# Patient Record
Sex: Female | Born: 1935 | Race: Black or African American | Hispanic: No | Smoking: Never smoker
Health system: Southern US, Community
[De-identification: ages and names within clinical notes are randomized; demographics above are authoritative.]

## PROBLEM LIST (undated history)

## (undated) DIAGNOSIS — Y92009 Unspecified place in unspecified non-institutional (private) residence as the place of occurrence of the external cause: Secondary | ICD-10-CM

## (undated) DIAGNOSIS — G5600 Carpal tunnel syndrome, unspecified upper limb: Secondary | ICD-10-CM

## (undated) DIAGNOSIS — W19XXXA Unspecified fall, initial encounter: Secondary | ICD-10-CM

## (undated) DIAGNOSIS — K219 Gastro-esophageal reflux disease without esophagitis: Secondary | ICD-10-CM

## (undated) DIAGNOSIS — I10 Essential (primary) hypertension: Secondary | ICD-10-CM

## (undated) DIAGNOSIS — R159 Full incontinence of feces: Secondary | ICD-10-CM

## (undated) DIAGNOSIS — R634 Abnormal weight loss: Secondary | ICD-10-CM

## (undated) DIAGNOSIS — M47812 Spondylosis without myelopathy or radiculopathy, cervical region: Secondary | ICD-10-CM

## (undated) DIAGNOSIS — J45909 Unspecified asthma, uncomplicated: Secondary | ICD-10-CM

## (undated) DIAGNOSIS — F329 Major depressive disorder, single episode, unspecified: Secondary | ICD-10-CM

## (undated) DIAGNOSIS — R768 Other specified abnormal immunological findings in serum: Secondary | ICD-10-CM

## (undated) DIAGNOSIS — K5790 Diverticulosis of intestine, part unspecified, without perforation or abscess without bleeding: Secondary | ICD-10-CM

## (undated) DIAGNOSIS — K922 Gastrointestinal hemorrhage, unspecified: Secondary | ICD-10-CM

## (undated) DIAGNOSIS — M67919 Unspecified disorder of synovium and tendon, unspecified shoulder: Secondary | ICD-10-CM

## (undated) DIAGNOSIS — E785 Hyperlipidemia, unspecified: Secondary | ICD-10-CM

## (undated) DIAGNOSIS — M1990 Unspecified osteoarthritis, unspecified site: Secondary | ICD-10-CM

## (undated) DIAGNOSIS — M353 Polymyalgia rheumatica: Secondary | ICD-10-CM

## (undated) DIAGNOSIS — I672 Cerebral atherosclerosis: Secondary | ICD-10-CM

## (undated) DIAGNOSIS — F419 Anxiety disorder, unspecified: Secondary | ICD-10-CM

## (undated) DIAGNOSIS — M109 Gout, unspecified: Secondary | ICD-10-CM

## (undated) DIAGNOSIS — F32A Depression, unspecified: Secondary | ICD-10-CM

## (undated) HISTORY — DX: Unspecified disorder of synovium and tendon, unspecified shoulder: M67.919

## (undated) HISTORY — DX: Essential (primary) hypertension: I10

## (undated) HISTORY — DX: Unspecified place in unspecified non-institutional (private) residence as the place of occurrence of the external cause: Y92.009

## (undated) HISTORY — DX: Major depressive disorder, single episode, unspecified: F32.9

## (undated) HISTORY — DX: Diverticulosis of intestine, part unspecified, without perforation or abscess without bleeding: K57.90

## (undated) HISTORY — DX: Polymyalgia rheumatica: M35.3

## (undated) HISTORY — DX: Unspecified place in unspecified non-institutional (private) residence as the place of occurrence of the external cause: W19.XXXA

## (undated) HISTORY — DX: Cerebral atherosclerosis: I67.2

## (undated) HISTORY — DX: Gastrointestinal hemorrhage, unspecified: K92.2

## (undated) HISTORY — DX: Spondylosis without myelopathy or radiculopathy, cervical region: M47.812

## (undated) HISTORY — DX: Unspecified osteoarthritis, unspecified site: M19.90

## (undated) HISTORY — DX: Unspecified asthma, uncomplicated: J45.909

## (undated) HISTORY — DX: Gastro-esophageal reflux disease without esophagitis: K21.9

## (undated) HISTORY — DX: Other specified abnormal immunological findings in serum: R76.8

## (undated) HISTORY — DX: Carpal tunnel syndrome, unspecified upper limb: G56.00

## (undated) HISTORY — DX: Full incontinence of feces: R15.9

## (undated) HISTORY — DX: Hyperlipidemia, unspecified: E78.5

## (undated) HISTORY — DX: Depression, unspecified: F32.A

## (undated) HISTORY — DX: Gout, unspecified: M10.9

## (undated) HISTORY — DX: Anxiety disorder, unspecified: F41.9

## (undated) HISTORY — DX: Abnormal weight loss: R63.4

---

## 2001-09-28 DIAGNOSIS — K922 Gastrointestinal hemorrhage, unspecified: Secondary | ICD-10-CM

## 2001-09-28 HISTORY — DX: Gastrointestinal hemorrhage, unspecified: K92.2

## 2002-03-01 ENCOUNTER — Encounter: Payer: Self-pay | Admitting: Emergency Medicine

## 2002-03-01 ENCOUNTER — Inpatient Hospital Stay (HOSPITAL_COMMUNITY): Admission: EM | Admit: 2002-03-01 | Discharge: 2002-03-06 | Payer: Self-pay | Admitting: Emergency Medicine

## 2005-06-26 ENCOUNTER — Emergency Department (HOSPITAL_COMMUNITY): Admission: EM | Admit: 2005-06-26 | Discharge: 2005-06-26 | Payer: Self-pay | Admitting: Emergency Medicine

## 2006-05-10 ENCOUNTER — Encounter: Admission: RE | Admit: 2006-05-10 | Discharge: 2006-05-10 | Payer: Self-pay | Admitting: Internal Medicine

## 2008-08-22 ENCOUNTER — Ambulatory Visit (HOSPITAL_COMMUNITY): Admission: RE | Admit: 2008-08-22 | Discharge: 2008-08-22 | Payer: Self-pay | Admitting: Internal Medicine

## 2008-08-22 ENCOUNTER — Encounter (INDEPENDENT_AMBULATORY_CARE_PROVIDER_SITE_OTHER): Payer: Self-pay | Admitting: Internal Medicine

## 2008-08-22 ENCOUNTER — Ambulatory Visit: Payer: Self-pay | Admitting: Vascular Surgery

## 2011-02-13 NOTE — H&P (Signed)
[place]  Patient:    Rebekah Clark, Rebekah Clark Visit Number: [id number] MRN: [id number]          Service Type: MED Location: [id number] Attending Physician:  Osvaldo Human Dictated by:   Thora Lance, M.D. Admit Date:  03/01/2002   CC:         Pearla Dubonnet, M.D.   History and Physical  CHIEF COMPLAINT: Rectal bleeding.  HISTORY OF PRESENT ILLNESS: This is an 77 year old black female, with a history of hypertension and diverticulosis, who presents with a lower GI bleed.  She presented to Dr. Kevan Ny at our office one week ago complaining of left lower quadrant abdominal pain and some diarrhea.  She was given five days of Tequin for diverticulitis and also has been taking Advil one or two a day for abdominal pain.  She had improvement in her abdominal pain up until tonight when she had recurrence of some mild left lower quadrant pain followed by a bloody bowel movement.  She then proceeded to have three further bloody bowel movements.  She had a brief syncopal episode while standing and then was brought to the emergency room by EMT.  She was not found to be orthostatic when checked by the EMS.  She has no history of GI bleed in the past.  She does have a history of colon polyps diagnosed by colonoscopy in the 1990s. She has had a follow-up colonoscopy and a sigmoidoscopy about a year ago without recurrence of polyps.  She apparently does have known diverticulosis, per the patient.  PAST MEDICAL HISTORY:  1. Hypertension.  2. DJD.  3. Allergic rhinitis.  4. Hearing loss.  5. Colon polyps.  6. Diverticulosis.  PAST SURGICAL HISTORY:  1. GYN procedure.  2. Another surgery that she does not remember.  ALLERGIES: CODEINE.  CURRENT MEDICATIONS:  1. Benicar 10 mg q.d.  2. Multivitamin.  3. Advil p.r.n.  FAMILY HISTORY: Noncontributory.  SOCIAL HISTORY: Married.  No children.  Nonsmoker.  No alcohol.  She is a retired  Public house manager.  REVIEW OF SYSTEMS: Denies chest pain, shortness of breath, palpitations.  PHYSICAL EXAMINATION:  GENERAL: Well-appearing black female.  VITAL SIGNS: Blood pressure 147/78, heart rate 109, respirations 18, temperature 98.7 degrees.  Oxygen saturation 97% on room air.  HEENT: PERRL.  EOMI.  Funduscopic not done.  Ears, tympanic membranes are clear.  Oral cavity and pharynx clear.  NECK: Supple.  No lymphadenopathy, no carotid bruits.  LUNGS: Clear.  HEART: Regular rate and rhythm without murmur, gallop, or rub.  ABDOMEN: Soft.  There is mild left lower quadrant tenderness.  No rebound or guarding.  Normal bowel sounds.  GYN: Deferred.  RECTAL: Gross blood per EDP.  EXTREMITIES: No edema.  NEUROLOGIC: Nonfocal.  LABORATORY DATA: CBC, hemoglobin 8.5, WBC 7.6, platelet count 270,000; MCV 85. Chemistries - sodium 142, potassium 3.9, chloride 106, carbon dioxide 29, glucose 194, BUN 18, creatinine 1.3, calcium 8.3, total protein 5.7, albumin 2.2, SGOT 15, SGPT 9, alkaline phosphatase 69, total bilirubin 0.2.  Amylase 113, lipase 34.  Urinalysis negative.  ASSESSMENT:  1. Lower gastrointestinal bleed, likely diverticular bleed.  She has had     colonoscopy x2 and flexible sigmoidoscopy x1 over the last several years.  2. Brief syncopal episode, likely related to #1.  3. Anemia secondary to #1.  4. Recent diverticulitis.  Still has some tenderness in the left lower     quadrant.  5. Hypertension.  PLAN:  1. Admit.  2. IV fluids.  3. Serial hemoglobins.  4. Transfuse if hemoglobin drops lower than 8.  5. Tequin p.o.  6. Clear liquids.  7. Will check old records on recent sigmoidoscopy/colonoscopy.  She may not     need a repeat procedure. Dictated by:   Thora Lance, M.D. Attending Physician:  Osvaldo Human DD:  03/01/02 TD:  03/02/02 Job: [id number]

## 2011-02-13 NOTE — Discharge Summary (Signed)
[place]  Patient:    Rebekah Clark, Rebekah Clark Visit Number: [id number] MRN: [id number]          Service Type: MED Location: 3700 [id number] [id number] Attending Physician:  Pearla Dubonnet Dictated by:   Pearla Dubonnet, M.D. Admit Date:  03/01/2002 Discharge Date: 03/06/2002                             Discharge Summary  PROBLEM LIST: 1. Lower gastrointestinal bleed, resolved. 2. Diverticulosis with resolved diverticulitis. 3. Hypertension. 4. Allergic rhinitis. 5. Hearing loss. 6. History of colon polyps.  DISCHARGE MEDICATIONS: 1. Benicar 10 mg daily. 2. Multivitamin one daily.  HISTORY OF PRESENT ILLNESS:  The patient is a very pleasant 77 year old female with a history of hypertension and diverticulosis, who presented on March 01, 2002 with bright red blood per rectum.  One week prior to admission, she had had some left lower quadrant abdominal pain and diarrhea and was given Tequin for five days for diverticulitis; she also had been taking one or two Advil a day for pain in the lower quadrant.  The night prior to admission, she had a bloody bowel movement followed by three more bloody bowel movements over the next couple of hours.  She had a brief syncopal episode apparently and was brought into the emergency room where she was not orthostatic but was admitted for a lower GI bleed and observation period.  HOSPITAL COURSE: #1 - GASTROINTESTINAL BLEED:  The patients hemoglobin in the emergency room was noted to be 8.5 at 3:55 a.m. but by 1330, it was 7.4.  She was transfused two units of blood and by 2355 on March 01, 2002, her hemoglobin was up to 9.4. She had no further episodes of rectal bleeding until the afternoon and evening of March 02, 2002 -- the next day -- where she again had some bright red blood per rectum and hemoglobin had dropped from 10.1 to 8.3.  She was transfused two more units of blood and hemoglobin on March 03, 2002 was 11.1.   Over the next several days, she did continue to clear some blood per rectum but hemoglobins remained stable and discharge hemoglobin was 10.6 on March 06, 2002.  Otherwise, the patients hospitalization was uneventful.  She had had a history of colonoscopy x2 in the past and had had a flexible sigmoidoscopy one year prior to admission.  The patient was discharged in stable condition on March 06, 2002.  #2 - HYPERTENSION:  In terms of her hypertension, her blood pressure was well-controlled while hospitalized on Avapro and she was switched back to Benicar on discharge.  She was eating well without pain, with a stable hemoglobin on March 06, 2002 and the patient was discharged home.  #3 - She was treated with Tequin 400 mg while hospitalized for any possible residual diverticulitis. Dictated by:   Pearla Dubonnet, M.D. Attending Physician:  Pearla Dubonnet DD:  04/19/02 TD:  04/25/02 Job: [phone] [id number]

## 2013-12-05 ENCOUNTER — Ambulatory Visit
Admission: RE | Admit: 2013-12-05 | Discharge: 2013-12-05 | Disposition: A | Discharge status: Home / Self Care | Payer: Medicare Other | Source: Ambulatory Visit | Attending: Internal Medicine | Admitting: Internal Medicine

## 2013-12-05 ENCOUNTER — Other Ambulatory Visit: Payer: Self-pay | Admitting: Internal Medicine

## 2013-12-05 DIAGNOSIS — R634 Abnormal weight loss: Secondary | ICD-10-CM

## 2015-02-05 IMAGING — CR DG CHEST 2V
2 series · 2 of 2 positions shown · non-contrast
Comparison: none

[w chest pa]
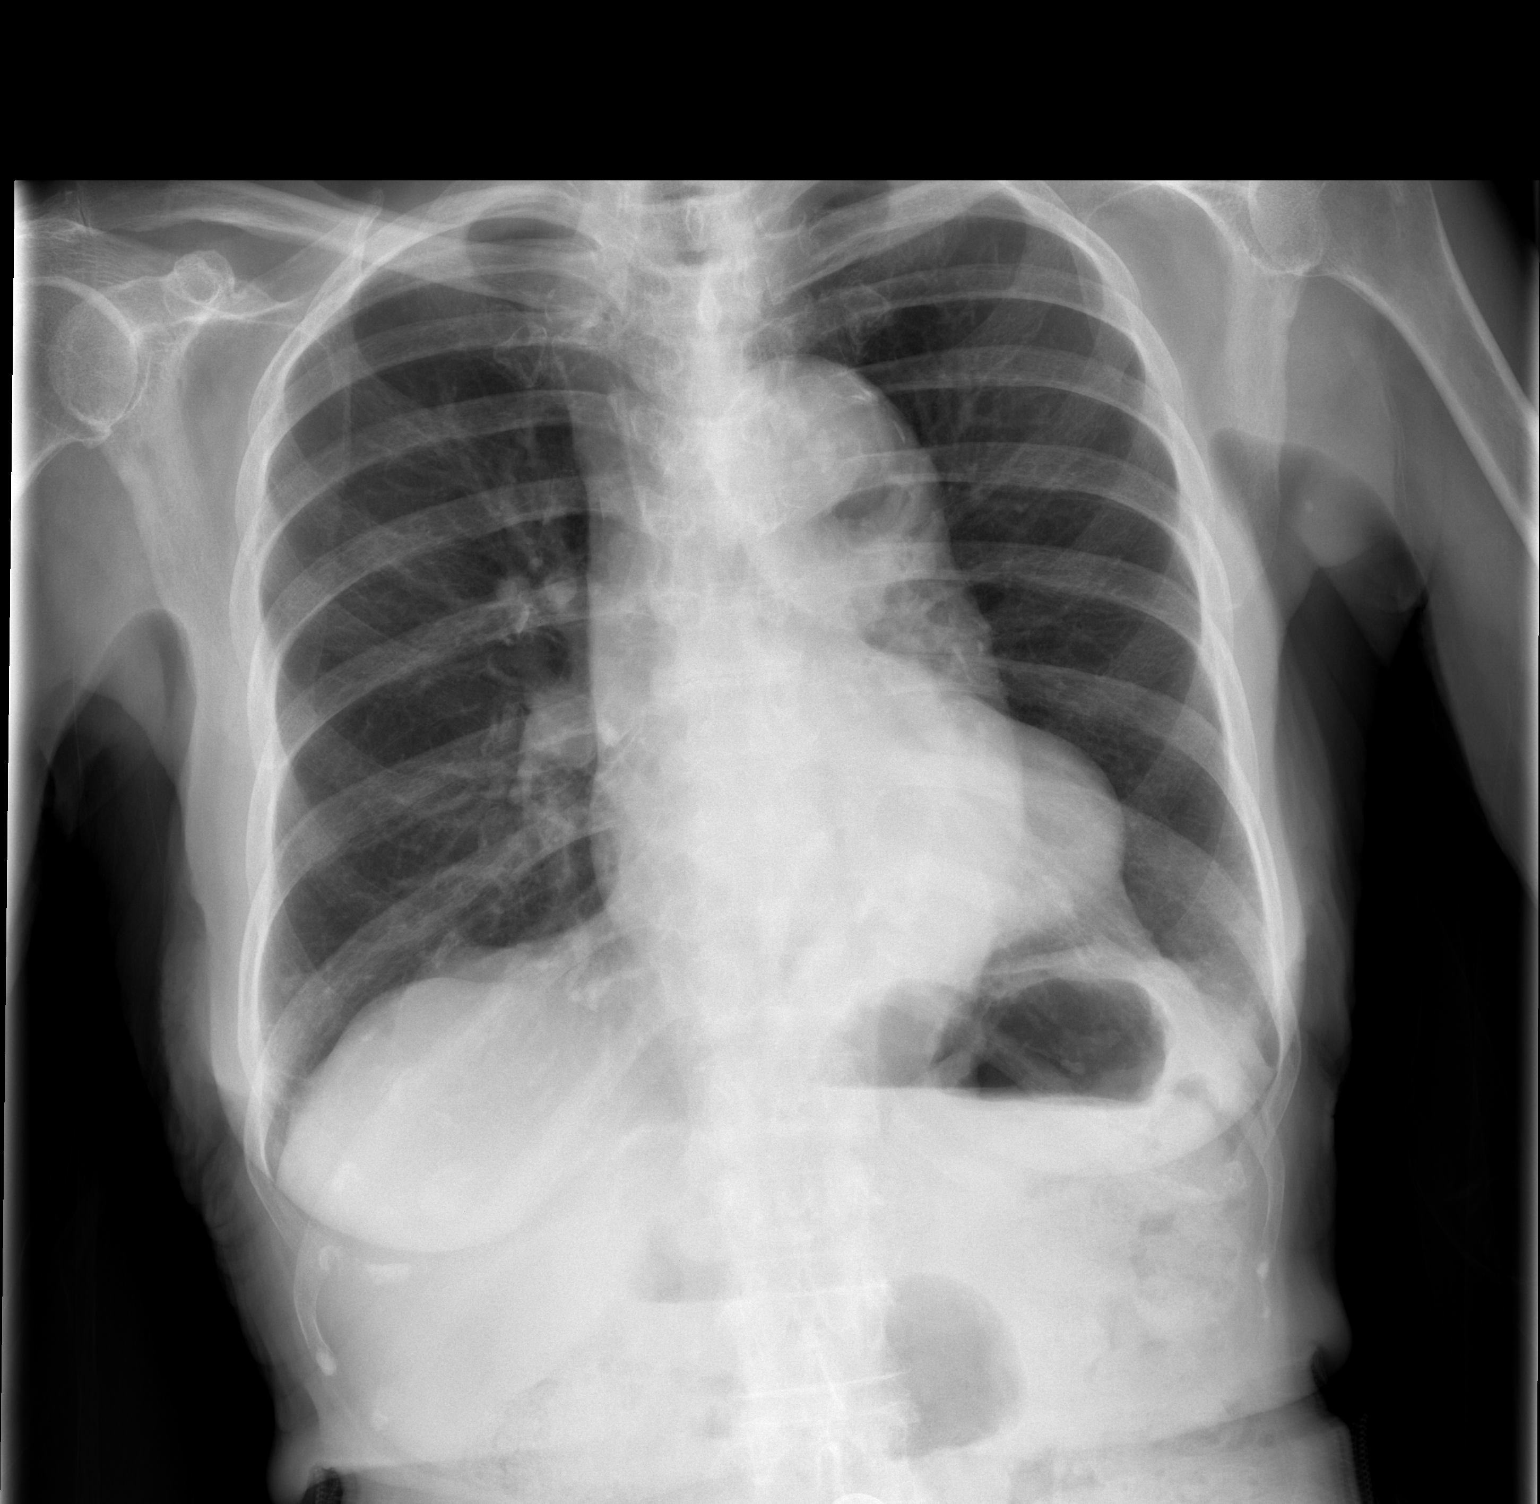

[w chest lat]
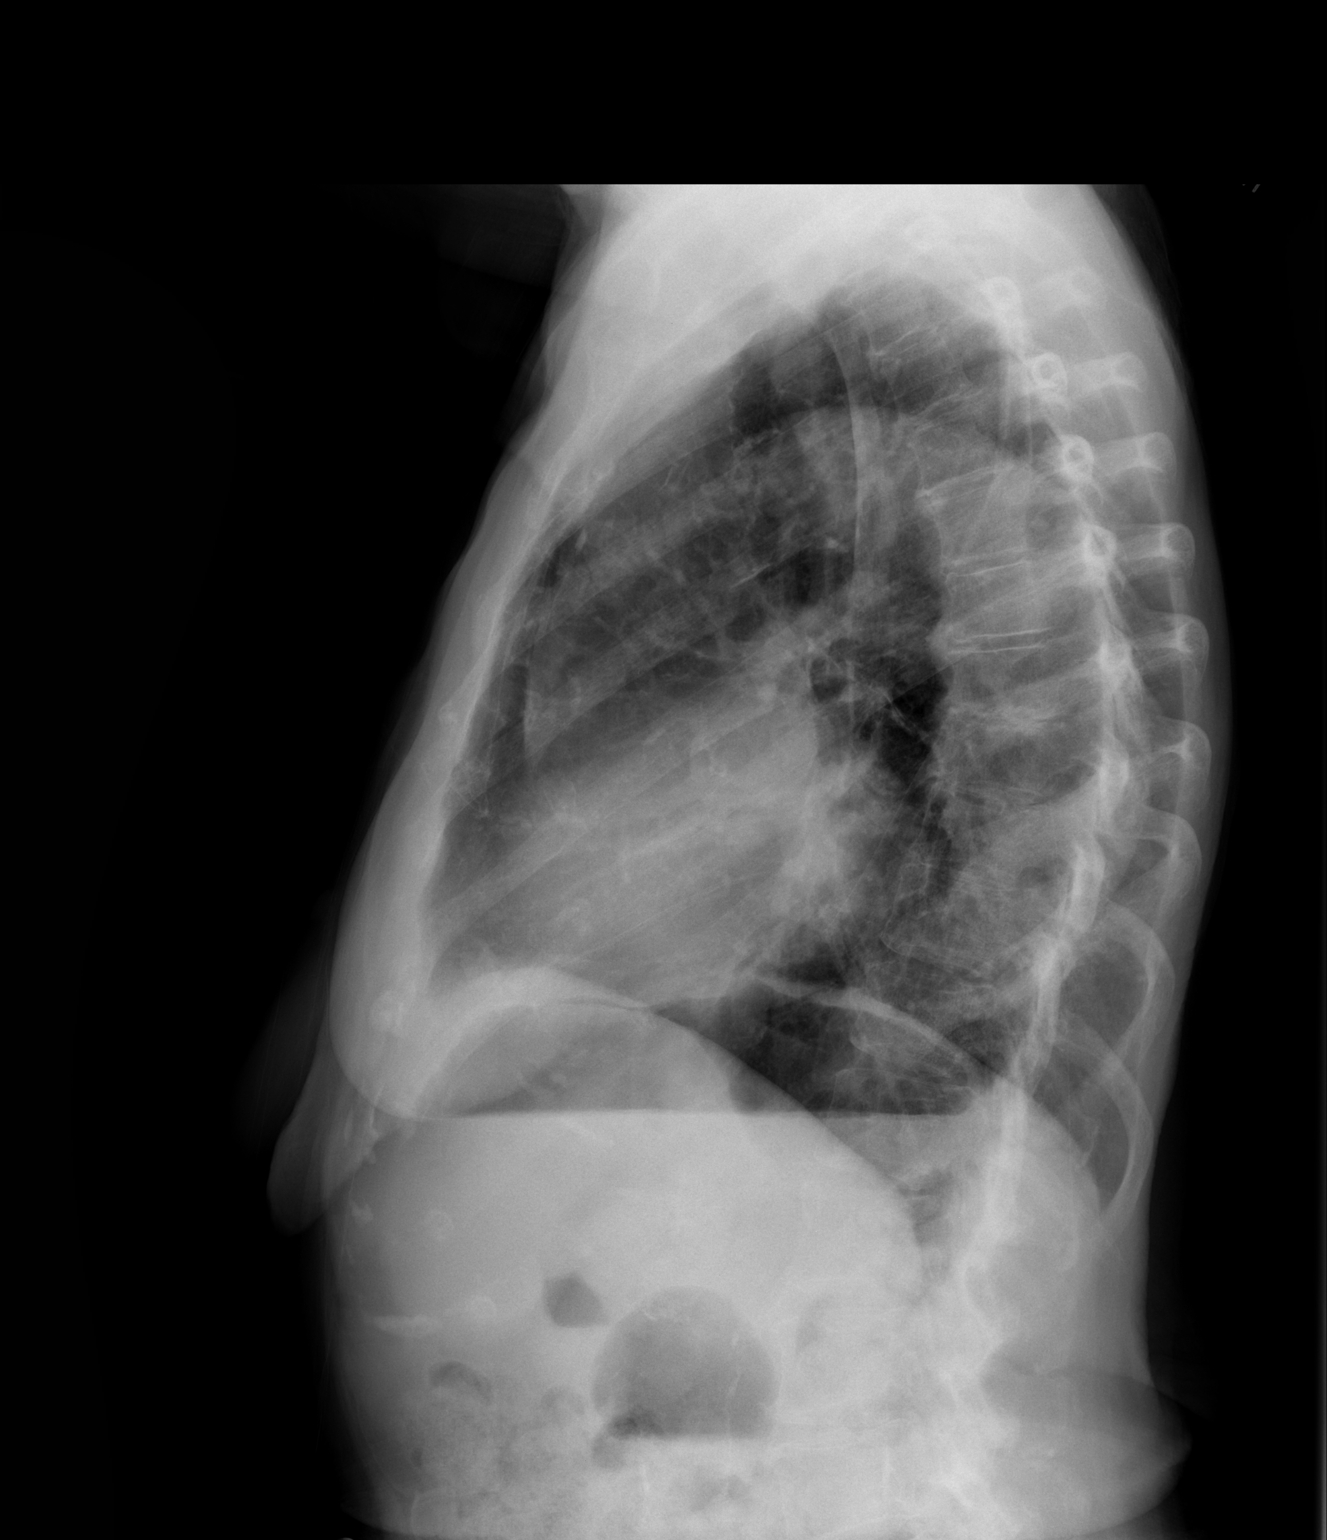

[2 of 2 positions shown; findings below may reference images not displayed]

CLINICAL DATA
Abnormal weight loss, left upper anterior chest pain

EXAM
CHEST  2 VIEW

COMPARISON
None.

FINDINGS
No active infiltrate or effusion is seen. The lungs are slightly
hyperaerated. The descending thoracic aorta is prominent and
ectatic. Correlation with prior chest x-ray or followup chest x-ray
is recommended to assess stability. The heart is mildly enlarged. No
acute bony abnormality is seen.

IMPRESSION
1. No infiltrate or effusion.
2. Widened and ectatic descending thoracic aorta. Cannot exclude an
acute process. Recommend comparison with prior or followup chest
x-ray versus CT of the chest if warranted clinically.

SIGNATURE

## 2015-06-13 ENCOUNTER — Non-Acute Institutional Stay (SKILLED_NURSING_FACILITY): Payer: Medicare Other | Admitting: Adult Health

## 2015-06-13 DIAGNOSIS — M353 Polymyalgia rheumatica: Secondary | ICD-10-CM

## 2015-06-13 DIAGNOSIS — I10 Essential (primary) hypertension: Secondary | ICD-10-CM | POA: Diagnosis not present

## 2015-06-13 DIAGNOSIS — M47812 Spondylosis without myelopathy or radiculopathy, cervical region: Secondary | ICD-10-CM

## 2015-06-13 DIAGNOSIS — J4520 Mild intermittent asthma, uncomplicated: Secondary | ICD-10-CM | POA: Diagnosis not present

## 2015-06-13 DIAGNOSIS — E785 Hyperlipidemia, unspecified: Secondary | ICD-10-CM

## 2015-06-13 DIAGNOSIS — K219 Gastro-esophageal reflux disease without esophagitis: Secondary | ICD-10-CM

## 2015-06-17 ENCOUNTER — Non-Acute Institutional Stay (SKILLED_NURSING_FACILITY): Payer: Medicare Other | Admitting: Internal Medicine

## 2015-06-17 DIAGNOSIS — F418 Other specified anxiety disorders: Secondary | ICD-10-CM | POA: Diagnosis not present

## 2015-06-17 DIAGNOSIS — K219 Gastro-esophageal reflux disease without esophagitis: Secondary | ICD-10-CM

## 2015-06-17 DIAGNOSIS — E785 Hyperlipidemia, unspecified: Secondary | ICD-10-CM | POA: Diagnosis not present

## 2015-06-17 DIAGNOSIS — J4520 Mild intermittent asthma, uncomplicated: Secondary | ICD-10-CM

## 2015-06-17 DIAGNOSIS — Z8639 Personal history of other endocrine, nutritional and metabolic disease: Secondary | ICD-10-CM

## 2015-06-17 DIAGNOSIS — H542 Low vision, both eyes: Secondary | ICD-10-CM

## 2015-06-17 DIAGNOSIS — Z8739 Personal history of other diseases of the musculoskeletal system and connective tissue: Secondary | ICD-10-CM

## 2015-06-17 DIAGNOSIS — I10 Essential (primary) hypertension: Secondary | ICD-10-CM | POA: Diagnosis not present

## 2015-06-17 DIAGNOSIS — M353 Polymyalgia rheumatica: Secondary | ICD-10-CM

## 2015-06-17 DIAGNOSIS — I350 Nonrheumatic aortic (valve) stenosis: Secondary | ICD-10-CM | POA: Diagnosis not present

## 2015-06-17 DIAGNOSIS — M47812 Spondylosis without myelopathy or radiculopathy, cervical region: Secondary | ICD-10-CM | POA: Diagnosis not present

## 2015-06-17 DIAGNOSIS — H543 Unqualified visual loss, both eyes: Secondary | ICD-10-CM

## 2015-06-17 DIAGNOSIS — F32A Depression, unspecified: Secondary | ICD-10-CM

## 2015-06-17 DIAGNOSIS — F329 Major depressive disorder, single episode, unspecified: Secondary | ICD-10-CM

## 2015-06-17 DIAGNOSIS — F419 Anxiety disorder, unspecified: Secondary | ICD-10-CM

## 2015-07-04 ENCOUNTER — Encounter: Payer: Self-pay | Admitting: Internal Medicine

## 2015-07-07 ENCOUNTER — Encounter: Payer: Self-pay | Admitting: Adult Health

## 2015-07-07 NOTE — Progress Notes (Signed)
Patient ID: Rebekah Clark, female   DOB: 11-Mar-1915, 81 y.o.   MRN: [id number]   Facility: [facility]      Allergies  Allergen Reactions  . Codeine   . Cozaar [Losartan Potassium]   . Crestor [Rosuvastatin Calcium]   . Dyazide [Hydrochlorothiazide W-Triamterene]   . Levaquin [Levofloxacin In D5w]   . Norvasc [Amlodipine Besylate]   . Vasotec [Enalapril]     Chief Complaint  Patient presents with  . Acute Visit    follow up transfer     HPI:  She has been transferred to this facility for long term placement. she states that she is feeling good and is not voicing any complaints or concerns at this time.  There are no nursing concerns at this time.   Past Medical History  Diagnosis Date  . Arthritis   . Hypertension   . Bowel incontinence   . PMR (polymyalgia rheumatica) (HCC)   . Cerebral atherosclerosis   . GERD (gastroesophageal reflux disease)   . Asthma   . Diverticulosis   . Weight loss   . Anxiety   . Depression   . Hyperlipidemia   . Helicobacter pylori ab+   . Gout   . DJD (degenerative joint disease) of cervical spine   . GI bleed 2003  . Rotator cuff disorder     left   . Carpal tunnel syndrome     right  . Fall at home     History reviewed. No pertinent past surgical history.  VITAL SIGNS BP 140/82 mmHg  Pulse 78  Ht  (1.575 m)  Wt 101 lb (45.813 kg)  BMI 18.47 kg/m2  Patient's Medications  New Prescriptions   No medications on file  Previous Medications   ATENOLOL (TENORMIN) 50 MG TABLET    Take 50 mg by mouth daily.   CALCIUM CARB-CHOLECALCIFEROL (CALCIUM 600 + D PO)    Take 1 tablet by mouth daily.   CALCIUM CARB-CHOLECALCIFEROL (LIQUID CALCIUM WITH D3) 600-500 MG-UNIT CAPS    Take by mouth.   CALCIUM CARBONATE (TUMS - DOSED IN MG ELEMENTAL CALCIUM) 500 MG CHEWABLE TABLET    Chew 1 tablet by mouth 4 (four) times daily.   OMEGA-3 FATTY ACIDS (FISH OIL) 1000 MG CAPS    Take 1,000 mg by mouth daily.   PREDNISONE  (DELTASONE) 5 MG TABLET    Take 5 mg by mouth daily with breakfast.   VITAMIN B-12 (CYANOCOBALAMIN) 1000 MCG TABLET    Take 1,000 mcg by mouth daily.  Modified Medications   No medications on file  Discontinued Medications   No medications on file     SIGNIFICANT DIAGNOSTIC EXAMS   LABS REVIEWED:   05-29-15: wbc 5.7; hgb 11.6; hct 36.5; mcv 89.3; plt 155; glucose 106; bun 28; creat 1.73; k+ 3.6; na++147; liver normal albumin 3.4; sed rate 46; vit b 12: 992; crp 33.6; tsh 0.73      Review of Systems  Constitutional: Negative for appetite change and fatigue.  HENT: Negative for congestion.   Respiratory: Negative for cough, chest tightness and shortness of breath.   Cardiovascular: Negative for chest pain, palpitations and leg swelling.  Gastrointestinal: Negative for nausea, abdominal pain, diarrhea and constipation.  Musculoskeletal: Negative for myalgias and arthralgias.  Skin: Negative for pallor.  Neurological: Negative for dizziness.  Psychiatric/Behavioral: The patient is not nervous/anxious.       Physical Exam  Constitutional: No distress.  Frail   Eyes: Conjunctivae are normal.  Neck: Neck  supple. No JVD present. No thyromegaly present.  Cardiovascular: Normal rate, regular rhythm and intact distal pulses.   Murmur heard. 3/6  Respiratory: Effort normal and breath sounds normal. No respiratory distress. She has no wheezes.  GI: Soft. Bowel sounds are normal. She exhibits no distension. There is no tenderness.  Musculoskeletal: She exhibits no edema.  Able to move all extremities   Lymphadenopathy:    She has no cervical adenopathy.  Neurological: She is alert.  Skin: Skin is warm and dry. She is not diaphoretic.  Psychiatric: She has a normal mood and affect.     ASSESSMENT/ PLAN:  1. PMR: is presently stable will continue prednisone 5 mg daily and will monitor her status.   Sed rate 46  2. Hypertension: will continue atenolol 50 mg daily and will  monitor   3. GERD: is presently not on medications; will not make changes will monitor   4. Asthma: is presently not on medications; will not make changes.   5.  Hyperlipidemia: is on fish oil 1 gm daily   6. Cervical spine DJD: is presently not on medication; will not make changes will monitor her status.    Time spent with patient   50 minutes >50% time spent counseling; reviewing medical record; tests; labs; and developing future plan of care       Synthia Innocent NP [facility]  Contact [phone] Monday through Friday 8am- 5pm  After hours call [phone]

## 2015-07-08 DIAGNOSIS — M353 Polymyalgia rheumatica: Secondary | ICD-10-CM | POA: Insufficient documentation

## 2015-07-08 DIAGNOSIS — M47812 Spondylosis without myelopathy or radiculopathy, cervical region: Secondary | ICD-10-CM | POA: Insufficient documentation

## 2015-07-08 DIAGNOSIS — I10 Essential (primary) hypertension: Secondary | ICD-10-CM | POA: Insufficient documentation

## 2015-07-08 DIAGNOSIS — J45909 Unspecified asthma, uncomplicated: Secondary | ICD-10-CM | POA: Insufficient documentation

## 2015-07-08 DIAGNOSIS — K219 Gastro-esophageal reflux disease without esophagitis: Secondary | ICD-10-CM | POA: Insufficient documentation

## 2015-07-08 DIAGNOSIS — E785 Hyperlipidemia, unspecified: Secondary | ICD-10-CM | POA: Insufficient documentation

## 2015-07-10 ENCOUNTER — Non-Acute Institutional Stay (SKILLED_NURSING_FACILITY): Payer: Medicare Other | Admitting: Adult Health

## 2015-07-10 DIAGNOSIS — M353 Polymyalgia rheumatica: Secondary | ICD-10-CM

## 2015-07-10 DIAGNOSIS — I10 Essential (primary) hypertension: Secondary | ICD-10-CM | POA: Diagnosis not present

## 2015-07-10 DIAGNOSIS — E785 Hyperlipidemia, unspecified: Secondary | ICD-10-CM

## 2015-07-10 DIAGNOSIS — R410 Disorientation, unspecified: Secondary | ICD-10-CM

## 2015-07-11 ENCOUNTER — Non-Acute Institutional Stay (SKILLED_NURSING_FACILITY): Payer: Medicare Other | Admitting: Adult Health

## 2015-07-11 DIAGNOSIS — R739 Hyperglycemia, unspecified: Secondary | ICD-10-CM | POA: Diagnosis not present

## 2015-07-11 DIAGNOSIS — E870 Hyperosmolality and hypernatremia: Secondary | ICD-10-CM

## 2015-07-11 DIAGNOSIS — N179 Acute kidney failure, unspecified: Secondary | ICD-10-CM | POA: Diagnosis not present

## 2015-07-11 DIAGNOSIS — F0391 Unspecified dementia with behavioral disturbance: Secondary | ICD-10-CM

## 2015-07-11 DIAGNOSIS — F0392 Unspecified dementia, unspecified severity, with psychotic disturbance: Secondary | ICD-10-CM

## 2015-07-11 DIAGNOSIS — R410 Disorientation, unspecified: Secondary | ICD-10-CM

## 2015-07-11 DIAGNOSIS — D72829 Elevated white blood cell count, unspecified: Secondary | ICD-10-CM | POA: Diagnosis not present

## 2015-07-17 ENCOUNTER — Encounter: Payer: Self-pay | Admitting: Adult Health

## 2015-07-17 ENCOUNTER — Non-Acute Institutional Stay (SKILLED_NURSING_FACILITY): Payer: Medicare Other | Admitting: Adult Health

## 2015-07-17 DIAGNOSIS — F0392 Unspecified dementia, unspecified severity, with psychotic disturbance: Secondary | ICD-10-CM

## 2015-07-17 DIAGNOSIS — F0391 Unspecified dementia with behavioral disturbance: Secondary | ICD-10-CM | POA: Diagnosis not present

## 2015-07-17 DIAGNOSIS — R627 Adult failure to thrive: Secondary | ICD-10-CM | POA: Insufficient documentation

## 2015-07-17 DIAGNOSIS — R410 Disorientation, unspecified: Secondary | ICD-10-CM | POA: Diagnosis not present

## 2015-07-17 DIAGNOSIS — E870 Hyperosmolality and hypernatremia: Secondary | ICD-10-CM | POA: Insufficient documentation

## 2015-07-17 DIAGNOSIS — N179 Acute kidney failure, unspecified: Secondary | ICD-10-CM | POA: Insufficient documentation

## 2015-07-17 DIAGNOSIS — D72829 Elevated white blood cell count, unspecified: Secondary | ICD-10-CM | POA: Insufficient documentation

## 2015-07-17 DIAGNOSIS — R739 Hyperglycemia, unspecified: Secondary | ICD-10-CM | POA: Insufficient documentation

## 2015-07-17 MED ORDER — LORAZEPAM 2 MG/ML PO CONC: 0.5000 mg | ORAL | Status: DC | PRN

## 2015-07-17 MED ORDER — MORPHINE SULFATE (CONCENTRATE) 10 MG /0.5 ML PO SOLN: 5.0000 mg | ORAL | Status: DC | PRN

## 2015-07-17 NOTE — Progress Notes (Signed)
Patient ID: Rebekah Clark, female   DOB: 01-11-1915, 81 y.o.   MRN: [id number]    Facility[facility]      Allergies  Allergen Reactions  . Codeine   . Cozaar [Losartan Potassium]   . Crestor [Rosuvastatin Calcium]   . Dyazide [Hydrochlorothiazide W-Triamterene]   . Levaquin [Levofloxacin In D5w]   . Norvasc [Amlodipine Besylate]   . Vasotec [Enalapril]     Chief Complaint  Patient presents with  . Acute Visit    patient status     HPI:  She was started on IVF due to acute renal failure last pm. She wbc is 16.3. There are no reports of fever present. She is unable to participate in the hpi or ros. She continues to hallucinate and does not recognize those around her. She is resistant to care. She is DNR; but per her MOST form; ivf are to be considered part of her treatment options. Per the nursing staff she continues to not eat or drink. Staff reports that she had said at one time; she would start eating once she got home.     Past Medical History  Diagnosis Date  . Arthritis   . Hypertension   . Bowel incontinence   . PMR (polymyalgia rheumatica) (HCC)   . Cerebral atherosclerosis   . GERD (gastroesophageal reflux disease)   . Asthma   . Diverticulosis   . Weight loss   . Anxiety   . Depression   . Hyperlipidemia   . Helicobacter pylori ab+   . Gout   . DJD (degenerative joint disease) of cervical spine   . GI bleed 2003  . Rotator cuff disorder     left   . Carpal tunnel syndrome     right  . Fall at home     No past surgical history on file.  VITAL SIGNS BP 150/80 mmHg  Pulse 68  Ht 5\' 2"  (1.575 m)  Wt 96 lb (43.545 kg)  BMI 17.55 kg/m2  SpO2 94%  Patient's Medications  New Prescriptions   No medications on file  Previous Medications   ATENOLOL (TENORMIN) 50 MG TABLET    Take 50 mg by mouth daily.   CALCIUM CARB-CHOLECALCIFEROL (CALCIUM 600 + D PO)    Take 1 tablet by mouth daily.   CALCIUM CARBONATE (TUMS - DOSED IN MG  ELEMENTAL CALCIUM) 500 MG CHEWABLE TABLET    Chew 1 tablet by mouth 4 (four) times daily.   OMEGA-3 FATTY ACIDS (FISH OIL) 1000 MG CAPS    Take 1,000 mg by mouth daily.   PREDNISONE (DELTASONE) 5 MG TABLET    Take 5 mg by mouth daily with breakfast.   VITAMIN B-12 (CYANOCOBALAMIN) 1000 MCG TABLET    Take 1,000 mcg by mouth daily.  Modified Medications   No medications on file  Discontinued Medications   No medications on file     SIGNIFICANT DIAGNOSTIC EXAMS   06-19-15: chest x-ray: aortic aneurysm:  07-11-15: chest x-ray; no acute cardiopulmonary disease negative for focal pneumonia     LABS REVIEWED:   05-29-15: wbc 5.7; hgb 11.6; hct 36.5; mcv 89.3; plt 155; glucose 106; bun 28; creat 1.73; k+ 3.6; na++147; liver normal albumin 3.4; sed rate 46; vit b 12: 992; crp 33.6; tsh 0.73  07-10-15: wbc 16.3; hgb 12.6; hct 42.1; mcv 92; plt 131; glucose 73; bun 89.3; creat 2.18; k+ 4.1; na++155; liver normal albumin 3.5 07-11-15: wbc 18.3; hgb 12.9; hct 41.4; mcv 91.4; plt 127;  glucose 318; bun 82.6; creat 2.03; k+ 3.5; na++147      Review of Systems Unable to perform ROS: Dementia (and medical acuity )   Physical Exam Constitutional: She appears distressed.  Frail   Eyes: Conjunctivae are normal.  Neck: Neck supple. No JVD present. No thyromegaly present.  Cardiovascular: Normal rate, regular rhythm and intact distal pulses.   Murmur heard. 3/6  Respiratory: Effort normal and breath sounds normal. No respiratory distress. She has no wheezes.  GI: Soft. Bowel sounds are normal. She exhibits no distension. There is no tenderness.  Musculoskeletal: She exhibits no edema.  Able to move all extremities   Lymphadenopathy:    She has no cervical adenopathy.  Neurological:  Is not aware of people around her.   Skin: Skin is warm and dry. She is not diaphoretic.  Psychiatric: She has a normal mood and affect.       ASSESSMENT/ PLAN:  1. Acute delirium 2. Acute renal  failure 3. Hypernatremia 4. Dementia 5. hyperglycemia  Will give her 250 cc fluid bolus then begin D5 at 125 cc per hour Due to her elevated WBC will begin levaquin 500 mg every 48 hours; I am aware she has an intolerance to this medication which can cause her diarrhea Will get blood culture X2 sites Will repeat cbc and cmp in the AM  For her hyperglycemia: will check cbg every 6 hours and will give 2 units novolog  Will monitor her status    Time spent with patient 50   minutes >50% time spent counseling; reviewing medical record; tests; labs; and developing future plan of care     Synthia Innocent NP [facility]  Contact [phone] Monday through Friday 8am- 5pm  After hours call [phone]

## 2015-07-17 NOTE — Progress Notes (Signed)
Patient ID: Rebekah Clark, female   DOB: [birth year], 81 y.o.   MRN: [id number]   [facility]ility[facility]      Allergies  Allergen Reactions  . Codeine   . Cozaar [Losartan Potassium]   . Crestor [Rosuvastatin Calcium]   . Dyazide [Hydrochlorothiazide W-Triamterene]   . Levaquin [Levofloxacin In D5w]   . Norvasc [Amlodipine Besylate]   . Vasotec [Enalapril]     Chief Complaint  Patient presents with  . Acute Visit    follow up patient status     HPI:  She has been pulling out her IV site. She is unable to participate in the hpi or ros. She continues to not eat or drink. Her wbc has returned to normal levels; her renal function remains poor. She is not improving. I have had a prolonged discussion regarding her poor status and poor prognosis with her RP Marthenia Rollingonya Thompson. We discussed what the goals of care are; the decision was made to make her comfort care.    Past Medical History  Diagnosis Date  . Arthritis   . Hypertension   . Bowel incontinence   . PMR (polymyalgia rheumatica) (HCC)   . Cerebral atherosclerosis   . GERD (gastroesophageal reflux disease)   . Asthma   . Diverticulosis   . Weight loss   . Anxiety   . Depression   . Hyperlipidemia   . Helicobacter pylori ab+   . Gout   . DJD (degenerative joint disease) of cervical spine   . GI bleed 2003  . Rotator cuff disorder     left   . Carpal tunnel syndrome     right  . Fall at home     No past surgical history on file.  VITAL SIGNS BP 154/88 mmHg  Pulse 82  Temp(Src) 97.2 F (36.2 C)  Resp 16  Ht 5\' 2"  (1.575 m)  Wt 96 lb (43.545 kg)  BMI 17.55 kg/m2  SpO2 97%  Patient's Medications  New Prescriptions   No medications on file  Previous Medications   ATENOLOL (TENORMIN) 50 MG TABLET    Take 50 mg by mouth daily.   CALCIUM CARB-CHOLECALCIFEROL (CALCIUM 600 + D PO)    Take 1 tablet by mouth daily.   CALCIUM CARBONATE (TUMS - DOSED IN MG ELEMENTAL CALCIUM) 500 MG CHEWABLE TABLET     Chew 1 tablet by mouth 4 (four) times daily.   OMEGA-3 FATTY ACIDS (FISH OIL) 1000 MG CAPS    Take 1,000 mg by mouth daily.   PREDNISONE (DELTASONE) 5 MG TABLET    Take 5 mg by mouth daily with breakfast.   VITAMIN B-12 (CYANOCOBALAMIN) 1000 MCG TABLET    Take 1,000 mcg by mouth daily.  Modified Medications   No medications on file  Discontinued Medications   No medications on file     SIGNIFICANT DIAGNOSTIC EXAMS  06-19-15: chest x-ray: aortic aneurysm:  07-11-15: chest x-ray; no acute cardiopulmonary disease negative for focal pneumonia    07-12-15: chest x-ray; no acute cardiopulmonary findings. Aneurysmal dilatation of the thoracic aorta with possible ulceration    LABS REVIEWED:   05-29-15: wbc 5.7; hgb 11.6; hct 36.5; mcv 89.3; plt 155; glucose 106; bun 28; creat 1.73; k+ 3.6; na++147; liver normal albumin 3.4; sed rate 46; vit b 12: 992; crp 33.6; tsh 0.73  07-10-15: wbc 16.3; hgb 12.6; hct 42.1; mcv 92; plt 131; glucose 73; bun 89.3; creat 2.18; k+ 4.1; na++155; liver normal albumin 3.5 07-11-15: wbc 18.3; hgb  12.9; hct 41.4; mcv 91.4; plt 127; glucose 318; bun 82.6; creat 2.03; k+ 3.5; na++147 urine culture: no growth  07-12-15: wbc 18.5; hgb 11.9; hct 90.3; mcv 90.3; plt 96; glucose 90; bun 63.5; creat 1.62; k+  4.1; na++ 139; liver normal albumin 2.9 07-16-15; wbc 9.3; hgb 11.5; hct 38.7; mcv 92.0; plt 140; glucose 75; bun 42.7; creat 1.51; k+ 5.6; na++152; liver normal albumin 2.8       Review of Systems Unable to perform ROS: Dementia (and medical acuity )   Physical Exam Constitutional: no distress  Eyes: Conjunctivae are normal.  Neck: Neck supple. No JVD present. No thyromegaly present.  Cardiovascular: Normal rate, regular rhythm and intact distal pulses.   Murmur heard. 3/6  Respiratory: Effort normal and breath sounds normal. No respiratory distress. She has no wheezes.  GI: Soft. Bowel sounds are normal. She exhibits no distension. There is no  tenderness.  Musculoskeletal: She exhibits no edema.  Able to move all extremities   Lymphadenopathy:    She has no cervical adenopathy.  Neurological:  Is not aware of people around her.   Skin: Skin is warm and dry. She is not diaphoretic.     ASSESSMENT/ PLAN:  1. Dementia 2. FTT 3. Acute delirium  Will stop her medications Will begin ativan concentrate 0.5 mg every 4 hours as needed Will begin roxanol 5 mg every 4 hours as needed Will setup palliative care consult   Time spent with patient  50  minutes >50% time spent counseling; reviewing medical record; tests; labs; and developing future plan of care      Synthia Innocent NP [facility]  Contact [phone] Monday through Friday 8am- 5pm  After hours call [phone]

## 2015-07-17 NOTE — Progress Notes (Signed)
Patient ID: Rebekah Clark, female   DOB: May 28, 1915, 81 y.o.   MRN: [id number]    [facility]ility[facility]      Allergies  Allergen Reactions  . Codeine   . Cozaar [Losartan Potassium]   . Crestor [Rosuvastatin Calcium]   . Dyazide [Hydrochlorothiazide W-Triamterene]   . Levaquin [Levofloxacin In D5w]   . Norvasc [Amlodipine Besylate]   . Vasotec [Enalapril]     Chief Complaint  Patient presents with  . Medical Management of Chronic Issues    HPI:  She is a resident of this facility who is being seen for the management of her chronic illnesses. She is unable to participate in the hpi or ros. She has not been taking medications eating or drinking for the past several days per the nursing staff. She is actively hallucinating and is acute confused. There are no reports of fever present.    Past Medical History  Diagnosis Date  . Arthritis   . Hypertension   . Bowel incontinence   . PMR (polymyalgia rheumatica) (HCC)   . Cerebral atherosclerosis   . GERD (gastroesophageal reflux disease)   . Asthma   . Diverticulosis   . Weight loss   . Anxiety   . Depression   . Hyperlipidemia   . Helicobacter pylori ab+   . Gout   . DJD (degenerative joint disease) of cervical spine   . GI bleed 2003  . Rotator cuff disorder     left   . Carpal tunnel syndrome     right  . Fall at home     No past surgical history on file.  VITAL SIGNS BP 164/86 mmHg  Pulse 70  Ht 5' (1.524 m)  Wt 96 lb (43.545 kg)  BMI 18.75 kg/m2  SpO2 98%  Patient's Medications  New Prescriptions   No medications on file  Previous Medications   ATENOLOL (TENORMIN) 50 MG TABLET    Take 50 mg by mouth daily.   CALCIUM CARB-CHOLECALCIFEROL (CALCIUM 600 + D PO)    Take 1 tablet by mouth daily.   CALCIUM CARBONATE (TUMS - DOSED IN MG ELEMENTAL CALCIUM) 500 MG CHEWABLE TABLET    Chew 1 tablet by mouth 4 (four) times daily.   OMEGA-3 FATTY ACIDS (FISH OIL) 1000 MG CAPS    Take 1,000 mg by  mouth daily.   PREDNISONE (DELTASONE) 5 MG TABLET    Take 5 mg by mouth daily with breakfast.   VITAMIN B-12 (CYANOCOBALAMIN) 1000 MCG TABLET    Take 1,000 mcg by mouth daily.  Modified Medications   No medications on file  Discontinued Medications   No medications on file     SIGNIFICANT DIAGNOSTIC EXAMS  06-19-15: chest x-ray: aortic aneurysm:    LABS REVIEWED:   05-29-15: wbc 5.7; hgb 11.6; hct 36.5; mcv 89.3; plt 155; glucose 106; bun 28; creat 1.73; k+ 3.6; na++147; liver normal albumin 3.4; sed rate 46; vit b 12: 992; crp 33.6; tsh 0.73     Review of Systems  Unable to perform ROS: Dementia (and medical acuity )      Physical Exam  Constitutional: She appears distressed.  Frail   Eyes: Conjunctivae are normal.  Neck: Neck supple. No JVD present. No thyromegaly present.  Cardiovascular: Normal rate, regular rhythm and intact distal pulses.   Murmur heard. 3/6  Respiratory: Effort normal and breath sounds normal. No respiratory distress. She has no wheezes.  GI: Soft. Bowel sounds are normal. She exhibits no distension. There  is no tenderness.  Musculoskeletal: She exhibits no edema.  Able to move all extremities   Lymphadenopathy:    She has no cervical adenopathy.  Neurological:  Is not aware of people around her.   Skin: Skin is warm and dry. She is not diaphoretic.  Psychiatric: She has a normal mood and affect.       ASSESSMENT/ PLAN:   1. PMR: is presently stable will continue prednisone 5 mg daily and will monitor her status.   Sed rate 46  2. Hypertension: will continue atenolol 50 mg daily and will monitor   3. GERD: is presently not on medications; will not make changes will monitor   4. Asthma: is presently not on medications; will not make changes.   5.  Hyperlipidemia: is on fish oil 1 gm daily   6. Cervical spine DJD: is presently not on medication; will not make changes will monitor her status.   7. Acute delirium: will cbc; cmp;  ua/c&s. Will treat as indicated; staff has stated that she did eat lunch in the dining room today.     Synthia Innocent NP [facility]  Contact [phone] Monday through Friday 8am- 5pm  After hours call [phone]

## 2015-07-18 ENCOUNTER — Encounter: Payer: Self-pay | Admitting: Adult Health

## 2015-07-18 ENCOUNTER — Non-Acute Institutional Stay (SKILLED_NURSING_FACILITY): Payer: Medicare Other | Admitting: Adult Health

## 2015-07-18 DIAGNOSIS — F0392 Unspecified dementia, unspecified severity, with psychotic disturbance: Secondary | ICD-10-CM

## 2015-07-18 DIAGNOSIS — R627 Adult failure to thrive: Secondary | ICD-10-CM

## 2015-07-18 DIAGNOSIS — R410 Disorientation, unspecified: Secondary | ICD-10-CM | POA: Diagnosis not present

## 2015-07-18 DIAGNOSIS — F0391 Unspecified dementia with behavioral disturbance: Secondary | ICD-10-CM | POA: Diagnosis not present

## 2015-07-18 MED ORDER — MORPHINE SULFATE (CONCENTRATE) 10 MG /0.5 ML PO SOLN: 5.0000 mg | ORAL | Status: AC | PRN

## 2015-07-18 MED ORDER — LORAZEPAM 2 MG/ML PO CONC: 0.5000 mg | ORAL | Status: AC | PRN

## 2015-07-18 NOTE — Progress Notes (Signed)
Patient ID: Rebekah Clark, female   DOB: 1914-11-11, 81 y.o.   MRN: [id number]    [facility]ility[facility]      Allergies  Allergen Reactions  . Codeine   . Cozaar [Losartan Potassium]   . Crestor [Rosuvastatin Calcium]   . Dyazide [Hydrochlorothiazide W-Triamterene]   . Levaquin [Levofloxacin In D5w]   . Norvasc [Amlodipine Besylate]   . Vasotec [Enalapril]     Chief Complaint  Patient presents with  . Discharge Note    HPI:  Her family has decided to take her home with hospice for her final days of life. Palliative has seen her this AM. Her family would prefer that she spend the last of life at home. She will not need any dme; as this will come through hospice care. She will need her prescriptions written. She will follow up medically with hospice care.    Past Medical History  Diagnosis Date  . Arthritis   . Hypertension   . Bowel incontinence   . PMR (polymyalgia rheumatica) (HCC)   . Cerebral atherosclerosis   . GERD (gastroesophageal reflux disease)   . Asthma   . Diverticulosis   . Weight loss   . Anxiety   . Depression   . Hyperlipidemia   . Helicobacter pylori ab+   . Gout   . DJD (degenerative joint disease) of cervical spine   . GI bleed 2003  . Rotator cuff disorder     left   . Carpal tunnel syndrome     right  . Fall at home     No past surgical history on file.  VITAL SIGNS BP 160/80 mmHg  Pulse 80  Ht 5' (1.524 m)  Wt 96 lb (43.545 kg)  BMI 18.75 kg/m2  Patient's Medications  New Prescriptions   No medications on file  Previous Medications   LORAZEPAM (ATIVAN) 2 MG/ML CONCENTRATED SOLUTION    Take 0.3 mLs (0.6 mg total) by mouth every 4 (four) hours as needed for anxiety.   MORPHINE SULFATE (MORPHINE CONCENTRATE) 10 MG / 0.5 ML CONCENTRATED SOLUTION    Take 0.25 mLs (5 mg total) by mouth every 4 (four) hours as needed for severe pain.  Modified Medications   No medications on file  Discontinued Medications   No  medications on file     SIGNIFICANT DIAGNOSTIC EXAMS  06-19-15: chest x-ray: aortic aneurysm:  07-11-15: chest x-ray; no acute cardiopulmonary disease negative for focal pneumonia    07-12-15: chest x-ray; no acute cardiopulmonary findings. Aneurysmal dilatation of the thoracic aorta with possible ulceration    LABS REVIEWED:   05-29-15: wbc 5.7; hgb 11.6; hct 36.5; mcv 89.3; plt 155; glucose 106; bun 28; creat 1.73; k+ 3.6; na++147; liver normal albumin 3.4; sed rate 46; vit b 12: 992; crp 33.6; tsh 0.73  07-10-15: wbc 16.3; hgb 12.6; hct 42.1; mcv 92; plt 131; glucose 73; bun 89.3; creat 2.18; k+ 4.1; na++155; liver normal albumin 3.5 07-11-15: wbc 18.3; hgb 12.9; hct 41.4; mcv 91.4; plt 127; glucose 318; bun 82.6; creat 2.03; k+ 3.5; na++147 urine culture: no growth  07-12-15: wbc 18.5; hgb 11.9; hct 90.3; mcv 90.3; plt 96; glucose 90; bun 63.5; creat 1.62; k+  4.1; na++ 139; liver normal albumin 2.9 07-16-15; wbc 9.3; hgb 11.5; hct 38.7; mcv 92.0; plt 140; glucose 75; bun 42.7; creat 1.51; k+ 5.6; na++152; liver normal albumin 2.8  07-17-15: wbc 8.9 ;hgb 11.6; hct 39.1; mcv 92.2; plt 144; glucose 50; bun 38.9; creat 1.53;  k+ 4.6; na++148       Review of Systems Unable to perform ROS: Dementia (and medical acuity )   Physical Exam Constitutional: no distress  Eyes: Conjunctivae are normal.  Neck: Neck supple. No JVD present. No thyromegaly present.  Cardiovascular: Normal rate, regular rhythm and intact distal pulses.   Murmur heard. 3/6  Respiratory: Effort normal and breath sounds normal. No respiratory distress. She has no wheezes.  GI: Soft. Bowel sounds are normal. She exhibits no distension. There is no tenderness.  Musculoskeletal: She exhibits no edema.  Able to move all extremities   Lymphadenopathy:    She has no cervical adenopathy.  Neurological:  Is not aware of people around her.   Skin: Skin is warm and dry. She is not diaphoretic.     ASSESSMENT/  PLAN:   Will discharge her to home with hospice care. She will not need dme hospice will provide any necessary dme. Her prescriptions have been written for her roxanol (30 mL)  and ativan (30 mL). She will follow medically with hospice care.    Time spent with patient  40  minutes >50% time spent counseling; reviewing medical record; tests; labs; and developing future plan of care      Synthia Innocent NP [facility]  Contact [phone] Monday through Friday 8am- 5pm  After hours call [phone]

## 2015-07-30 DEATH — deceased

## 2015-11-25 NOTE — Progress Notes (Signed)
Patient ID: Rebekah Clark, female   DOB: Jul 12, 1915, 82 y.o.   MRN: [id number]    HISTORY AND PHYSICAL   DATE: 06/17/15  Location:  [facility]    Place of Service: SNF [phone])   Extended Emergency Contact Information Primary Emergency Contact: Jordan Hawks [place] Home Phone: [phone] Relation: Friend Secondary Emergency Contact: Cecile Sheerer [place] Home Phone: [phone] Relation: Daughter  Advanced Directive information  FULL CODE  Chief Complaint  Patient presents with  . New Admit To SNF    HPI:  82 yo female seen today as a new admission into SNF from home. She will be a long term resident. She has poor vision. No nursing issues. No falls since admission. She is sleeping well and eating ok. No change in bowel/bladder habits  PMR -  Stable on  prednisone 5 mg daily. Sed rate 46  Hypertension - takes atenolol 50 mg daily. BP fluctuating  GERD - stable off meds  Asthma - asymptomatic. She takes no meds   Hyperlipidemia - stable on fish oil  Cervical spine DJD/knee OA - pain is stable without meds   OU cats - she has reduced vision  Past Medical History  Diagnosis Date  . Arthritis   . Hypertension   . Bowel incontinence   . PMR (polymyalgia rheumatica) (HCC)   . Cerebral atherosclerosis   . GERD (gastroesophageal reflux disease)   . Asthma   . Diverticulosis   . Weight loss   . Anxiety   . Depression   . Hyperlipidemia   . Helicobacter pylori ab+   . Gout   . DJD (degenerative joint disease) of cervical spine   . GI bleed 2003  . Rotator cuff disorder     left   . Carpal tunnel syndrome     right  . Fall at home     No past surgical history on file.  No care team member to display  Social History   Social History  . Marital Status: Widowed    Spouse Name: N/A  . Number of Children: N/A  . Years of Education: N/A   Occupational History  . Not on file.   Social  History Main Topics  . Smoking status: Never Smoker   . Smokeless tobacco: Not on file  . Alcohol Use: No  . Drug Use: Not on file  . Sexual Activity: Not on file   Other Topics Concern  . Not on file   Social History Narrative     reports that she has never smoked. She does not have any smokeless tobacco history on file. She reports that she does not drink alcohol. Her drug history is not on file.  No family history on file. No family status information on file.     There is no immunization history on file for this patient.  Allergies  Allergen Reactions  . Codeine   . Cozaar [Losartan Potassium]   . Crestor [Rosuvastatin Calcium]   . Dyazide [Hydrochlorothiazide W-Triamterene]   . Levaquin [Levofloxacin In D5w]   . Norvasc [Amlodipine Besylate]   . Vasotec [Enalapril]     Medications: Patient's Medications  New Prescriptions   No medications on file  Previous Medications   No medications on file  Modified Medications   Modified Medication Previous Medication   LORAZEPAM (ATIVAN) 2 MG/ML CONCENTRATED SOLUTION LORazepam (ATIVAN) 2 MG/ML concentrated solution      Take 0.3 mLs (0.6 mg total) by mouth  every 4 (four) hours as needed for anxiety. Hard script will be given to family upon patient's discharge    Take 0.3 mLs (0.6 mg total) by mouth every 4 (four) hours as needed for anxiety.   MORPHINE SULFATE (MORPHINE CONCENTRATE) 10 MG / 0.5 ML CONCENTRATED SOLUTION Morphine Sulfate (MORPHINE CONCENTRATE) 10 mg / 0.5 ml concentrated solution      Take 0.25 mLs (5 mg total) by mouth every 4 (four) hours as needed for severe pain. Hard script to be give to patient's family upon discharge    Take 0.25 mLs (5 mg total) by mouth every 4 (four) hours as needed for severe pain.  Discontinued Medications   No medications on file    Review of Systems  Eyes: Positive for visual disturbance.  All other systems reviewed and are negative.   Filed Vitals:   06/17/15 1029  BP:  178/78   There is no weight on file to calculate BMI.  Physical Exam  Constitutional: She is oriented to person, place, and time. She appears well-developed.  Lying in bed in NAD  HENT:  Mouth/Throat: Oropharynx is clear and moist. No oropharyngeal exudate.  Eyes: Pupils are equal, round, and reactive to light. No scleral icterus.  Neck: Neck supple. Carotid bruit is present (b/l systolic from chest). No tracheal deviation present. No thyromegaly present.  Cardiovascular: Normal rate, regular rhythm and intact distal pulses.  Exam reveals no gallop and no friction rub.   Murmur (radiates to carotid b/l) heard.  Systolic murmur is present with a grade of 2/6  No LE edema b/l. no calf TTP.   Pulmonary/Chest: Effort normal and breath sounds normal. No stridor. No respiratory distress. She has no wheezes. She has no rales.  Abdominal: Soft. Bowel sounds are normal. She exhibits no distension and no mass. There is no hepatomegaly. There is no tenderness. There is no rebound and no guarding.  Musculoskeletal: She exhibits edema.  Lymphadenopathy:    She has no cervical adenopathy.  Neurological: She is alert and oriented to person, place, and time.  Skin: Skin is warm and dry. No rash noted.  Psychiatric: She has a normal mood and affect. Her behavior is normal. Thought content normal.     Labs reviewed: No results found for any previous visit.  No results found.   Assessment/Plan   ICD-9-CM ICD-10-CM   1. Essential hypertension, benign 401.1 I10   2. PMR (polymyalgia rheumatica) (HCC) 725 M35.3   3. DJD (degenerative joint disease) of cervical spine 721.0 M47.812   4. Asthma, chronic, mild intermittent, uncomplicated 493.90 J45.20   5. Dyslipidemia 272.4 E78.5   6. GERD without esophagitis 530.81 K21.9   7. Anxiety and depression 300.4 F41.8   8. History of gout V12.29 Z86.39   9. Aortic stenosis 424.1 I35.0   10. Decreased vision in both eyes 369.20 H54.2    due to cats in [facility]U      Cont current meds as ordered  Check BP qshift and record  PT/OT/ST as ordered  Refer to eye specialist for decreased vision  GOAL: short term rehab --> long term care. Communicated with pt and nursing.  Will follow  Kierstin January S. Ancil Linsey  [facility] [address] [place], [place] [address] [phone] Cell (Monday-Friday 8 AM - 5 PM) [phone] After 5 PM and follow prompts

## 2015-12-03 ENCOUNTER — Encounter: Payer: Self-pay | Admitting: Internal Medicine
